# Patient Record
Sex: Male | Born: 1965 | Race: White | Hispanic: No | Marital: Married | Smoking: Never smoker
Health system: Southern US, Community
[De-identification: ages and names within clinical notes are randomized; demographics above are authoritative.]

## PROBLEM LIST (undated history)

## (undated) HISTORY — PX: WRIST SURGERY: SHX841

---

## 2011-05-16 ENCOUNTER — Encounter (HOSPITAL_COMMUNITY): Payer: Self-pay | Admitting: Emergency Medicine

## 2011-05-16 ENCOUNTER — Emergency Department (HOSPITAL_COMMUNITY): Payer: Worker's Compensation

## 2011-05-16 ENCOUNTER — Emergency Department (HOSPITAL_COMMUNITY)
Admission: EM | Admit: 2011-05-16 | Discharge: 2011-05-16 | Disposition: A | Discharge status: Home / Self Care | Payer: Worker's Compensation | Attending: Emergency Medicine | Admitting: Emergency Medicine

## 2011-05-16 DIAGNOSIS — Y9269 Other specified industrial and construction area as the place of occurrence of the external cause: Secondary | ICD-10-CM | POA: Insufficient documentation

## 2011-05-16 DIAGNOSIS — R0602 Shortness of breath: Secondary | ICD-10-CM | POA: Insufficient documentation

## 2011-05-16 DIAGNOSIS — IMO0002 Reserved for concepts with insufficient information to code with codable children: Secondary | ICD-10-CM | POA: Insufficient documentation

## 2011-05-16 DIAGNOSIS — S93401A Sprain of unspecified ligament of right ankle, initial encounter: Secondary | ICD-10-CM

## 2011-05-16 DIAGNOSIS — S93409A Sprain of unspecified ligament of unspecified ankle, initial encounter: Secondary | ICD-10-CM | POA: Insufficient documentation

## 2011-05-16 MED ORDER — IBUPROFEN 800 MG PO TABS
800.0000 mg | ORAL_TABLET | Freq: Once | ORAL | Status: AC
  Administered 2011-05-16: 800 mg via ORAL
  Filled 2011-05-16: qty 1

## 2011-05-16 NOTE — ED Provider Notes (Signed)
Medical screening examination/treatment/procedure(s) were performed by non-physician practitioner and as supervising physician I was immediately available for consultation/collaboration.   Joya Gaskins, MD 05/16/11 [phone]

## 2011-05-16 NOTE — ED Provider Notes (Signed)
History     CSN: [id number]  Arrival date & time 05/16/11  1745   First MD Initiated Contact with Patient 05/16/11 1758      Chief Complaint  Patient presents with  . Ankle Pain    (Consider location/radiation/quality/duration/timing/severity/associated sxs/prior treatment) Patient is a 46 y.o. male presenting with ankle pain. The history is provided by the patient. No language interpreter was used.  Ankle Pain  The incident occurred 1 to 2 hours ago. The incident occurred at work. There was no injury mechanism. The pain is present in the right ankle. The pain is moderate. The pain has been constant since onset. Associated symptoms comments: Pain with weight bearing.. .    History reviewed. No pertinent past medical history.  History reviewed. No pertinent past surgical history.  History reviewed. No pertinent family history.  History  Substance Use Topics  . Smoking status: Never Smoker   . Smokeless tobacco: Not on file  . Alcohol Use: Yes      Review of Systems  Constitutional: Negative for fever.  Respiratory: Positive for shortness of breath. Negative for wheezing and stridor.        R lateral rib pain and PT.  Skin intact.  No crepitus.  All other systems reviewed and are negative.    Allergies  Review of patient's allergies indicates no known allergies.  Home Medications  No current outpatient prescriptions on file.  BP 133/94  Pulse 103  Temp(Src) 98 F (36.7 C) (Oral)  Resp 20  Ht 5\' 10"  (1.778 m)  Wt 275 lb (124.739 kg)  BMI 39.46 kg/m2  SpO2 100%  Physical Exam  Nursing note and vitals reviewed. Constitutional: He is oriented to person, place, and time. He appears well-developed and well-nourished. No distress.  HENT:  Head: Normocephalic and atraumatic.  Eyes: EOM are normal.  Neck: Normal range of motion.  Cardiovascular: Normal rate, regular rhythm, normal heart sounds and intact distal pulses.   Pulmonary/Chest: Effort normal and  breath sounds normal. No respiratory distress. He has no wheezes. He has no rales. He exhibits tenderness.  Abdominal: Soft. He exhibits no distension. There is no tenderness.  Musculoskeletal: Normal range of motion.       Feet:  Neurological: He is alert and oriented to person, place, and time.  Skin: Skin is warm and dry. He is not diaphoretic.  Psychiatric: He has a normal mood and affect. Judgment normal.    ED Course  Procedures (including critical care time)  Labs Reviewed - No data to display No results found.   No diagnosis found.    MDM          Worthy Rancher, PA 05/16/11 1956

## 2011-05-16 NOTE — ED Notes (Signed)
Pt returned from xray

## 2011-05-16 NOTE — ED Notes (Signed)
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 900lbs

## 2011-05-16 NOTE — ED Notes (Signed)
Raiford Noble, [place] in prior to RN, see PA assessment for further

## 2011-07-28 ENCOUNTER — Ambulatory Visit: Payer: Self-pay

## 2012-10-26 IMAGING — CR RIGHT ANKLE - COMPLETE 3+ VIEW
1 series · 5 of 5 positions shown · non-contrast
Comparison: none

REASON FOR EXAM: R ankle pain due to fall
COMMENTS:

PROCEDURE:     MDR - MDR ANKLE RIGHT COMPLETE  - July 28, 2011  [DATE]
RESULT:

[Series 1: ap · 0.17mm/px · 5 of 5 slices shown]
[im 1/5]
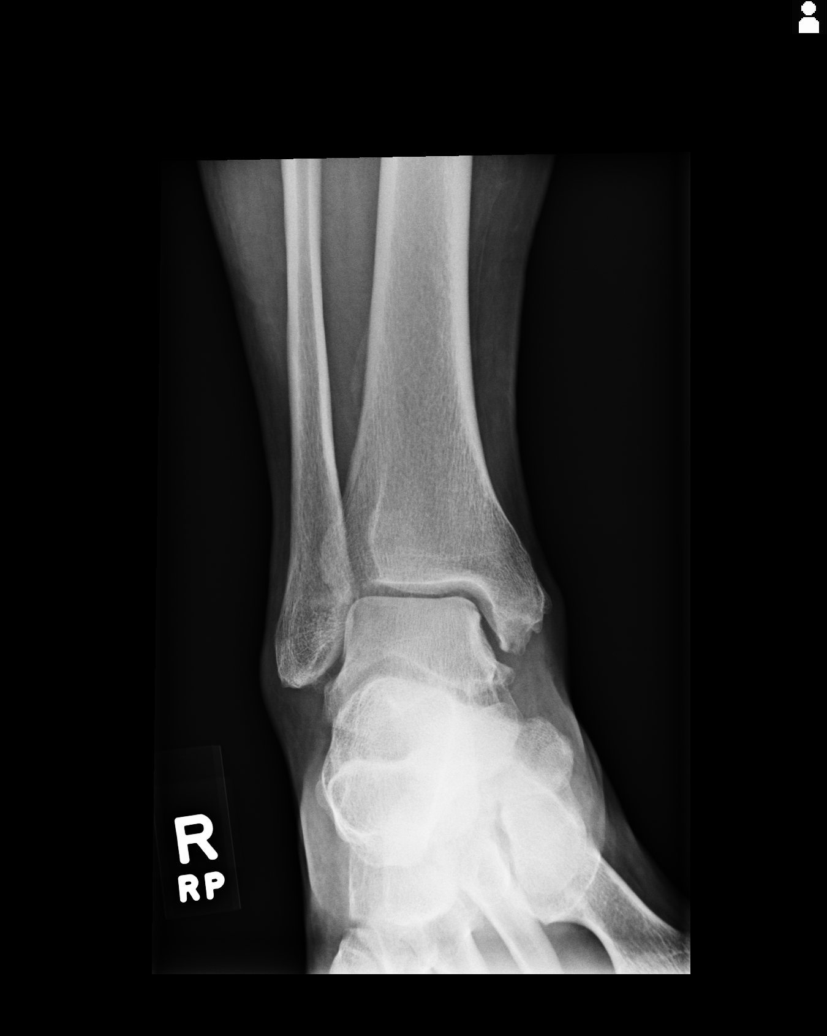
[im 2/5]
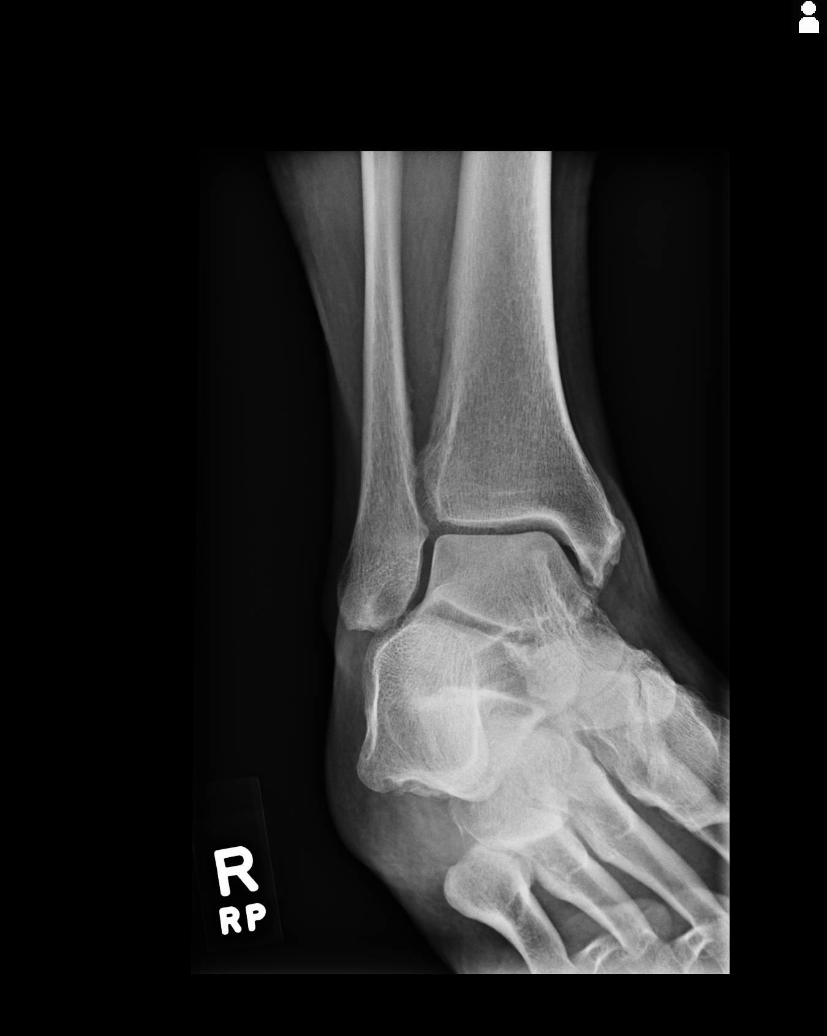
[im 3/5]
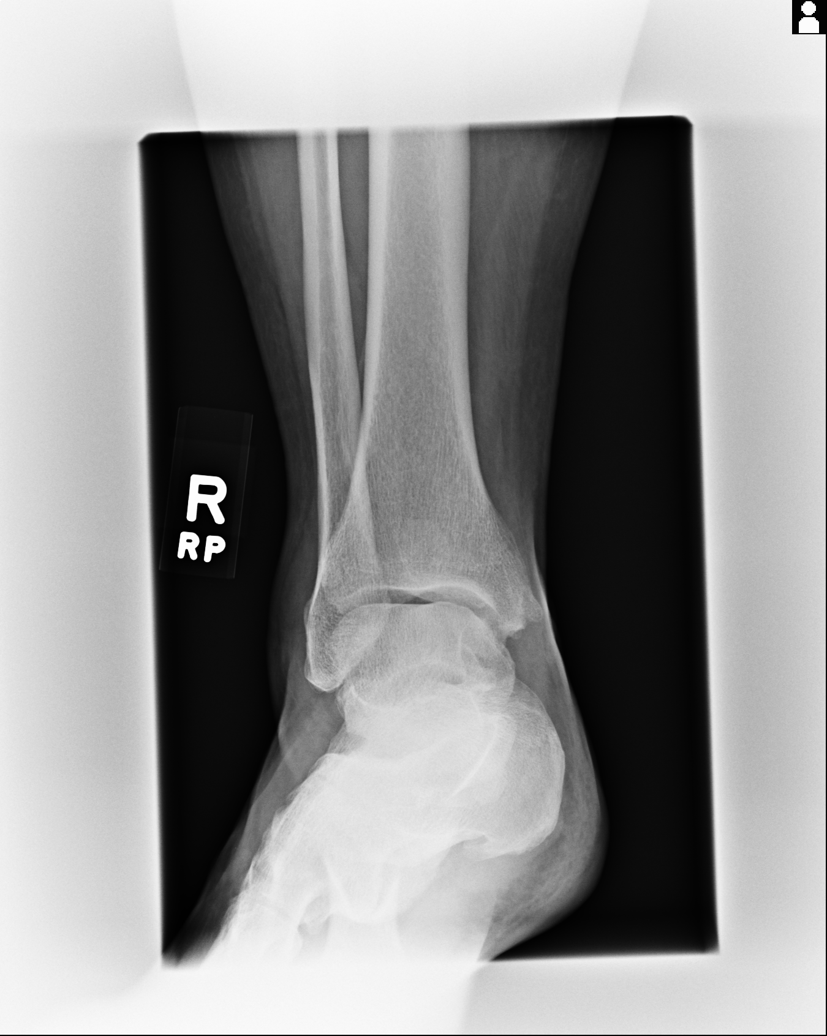
[im 4/5]
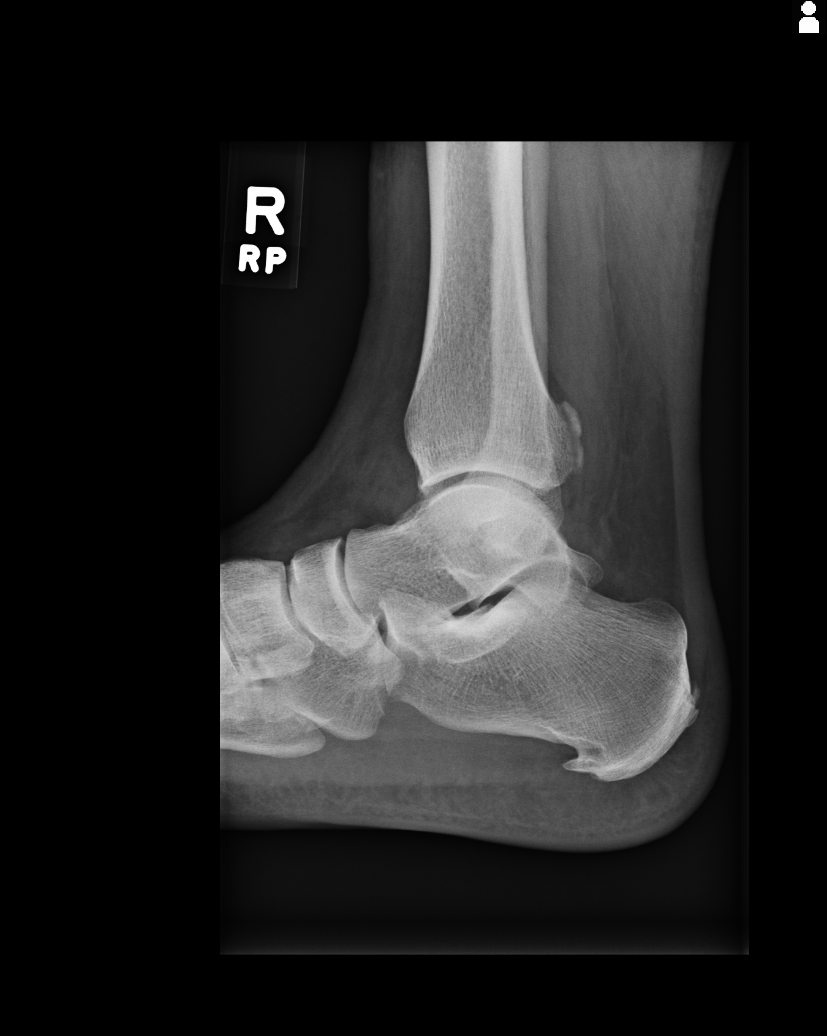
[im 5/5]
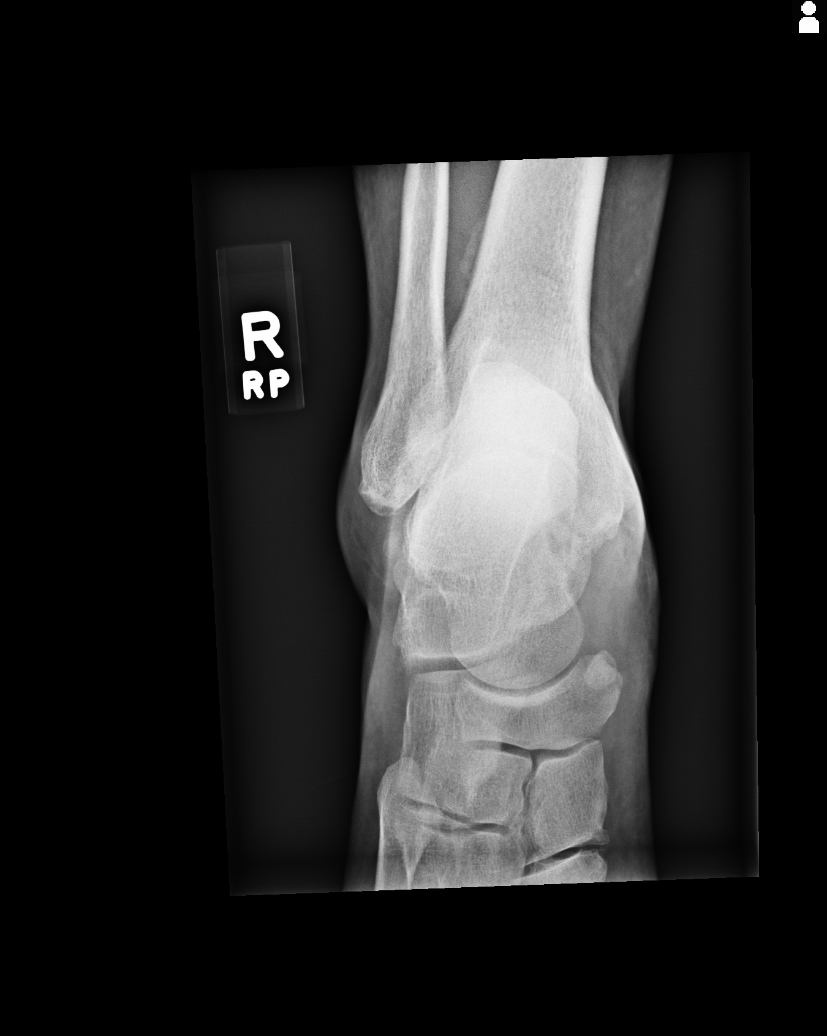

[5 of 5 positions shown; findings below may reference images not displayed]

FINDINGS: There is no evidence of fracture, dislocation or malalignment.
There is evidence of degenerative changes evident by peripheral
osteophytosis.
IMPRESSION: Mild osteoarthritic changes without evidence of acute osseous abnormalities.

## 2012-10-26 IMAGING — CR DG KNEE COMPLETE 4+V*R*
1 series · 5 of 5 positions shown · non-contrast
Comparison: none

REASON FOR EXAM: R knee pain due to fall
COMMENTS:

PROCEDURE:     MDR - MDR KNEE RT COMPLETE W/OBLIQUES  - July 28, 2011  [DATE]
RESULT:     There is no evidence of fracture, dislocation, or malalignment.

[Series 1: ap · 0.17mm/px · 5 of 5 slices shown]
[im 1/5]
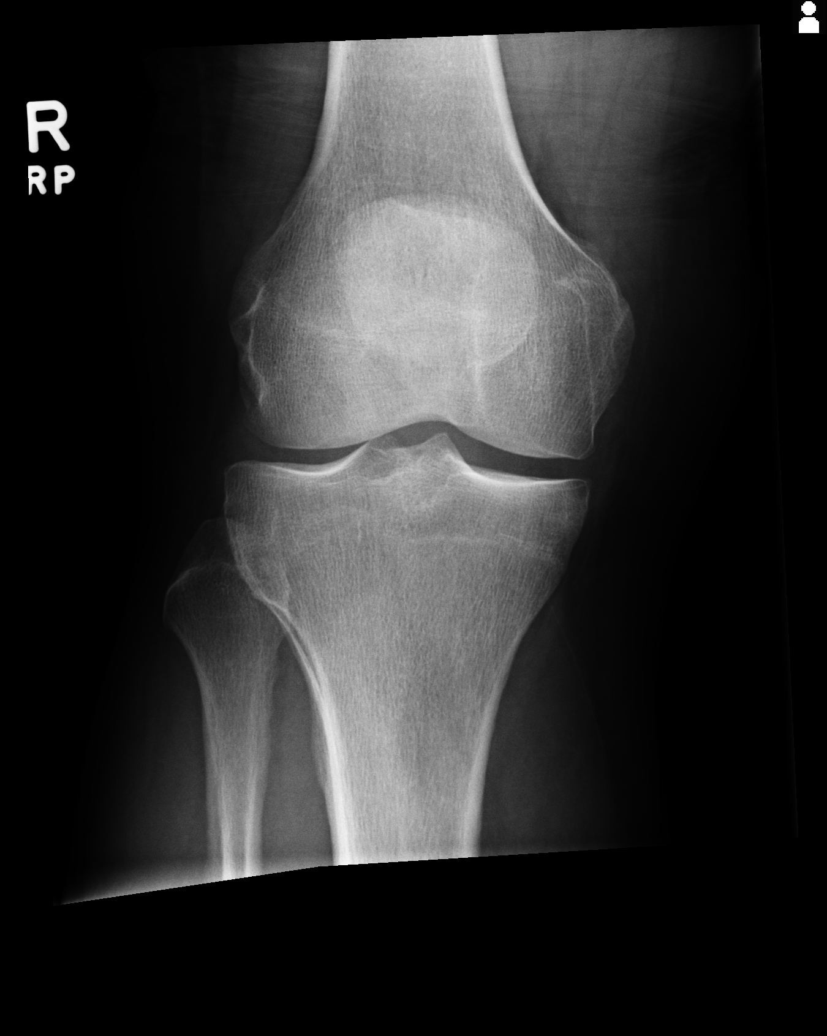
[im 2/5]
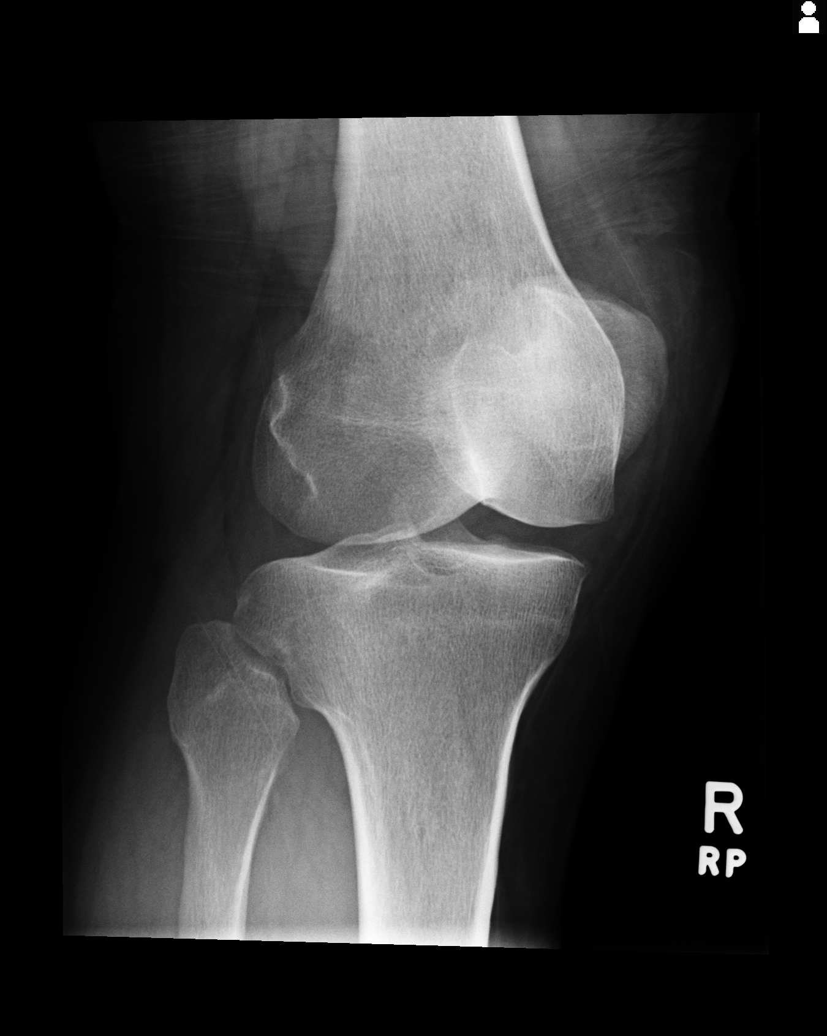
[im 3/5]
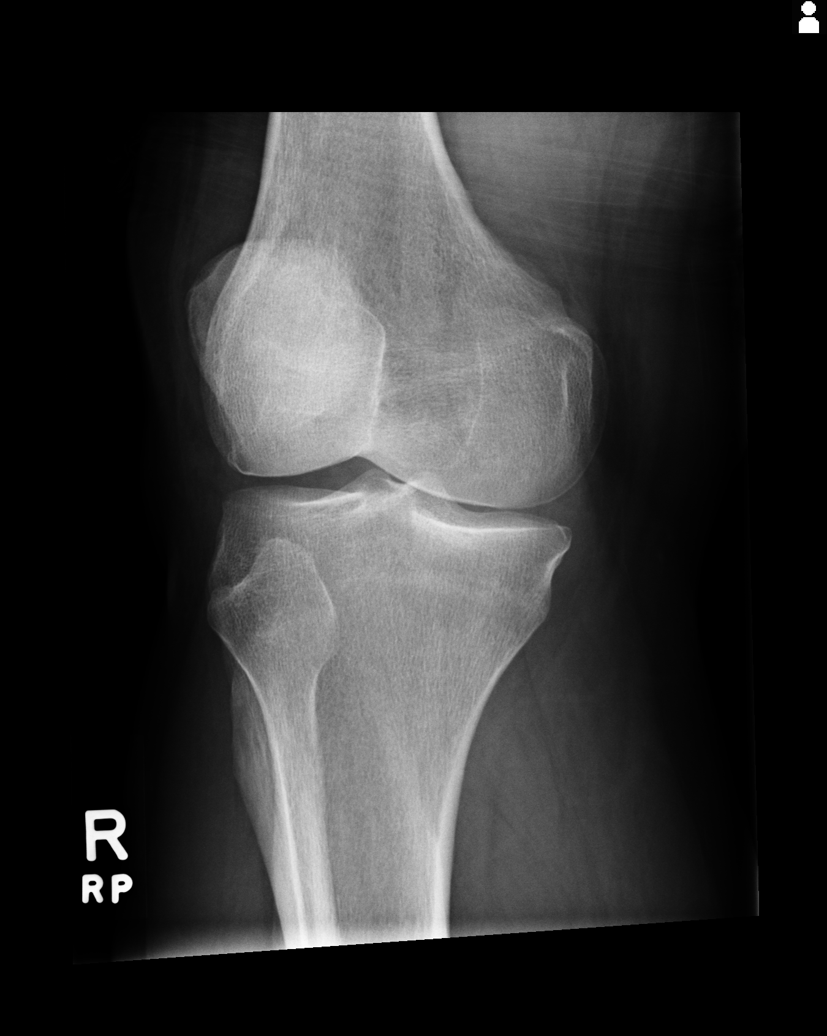
[im 4/5]
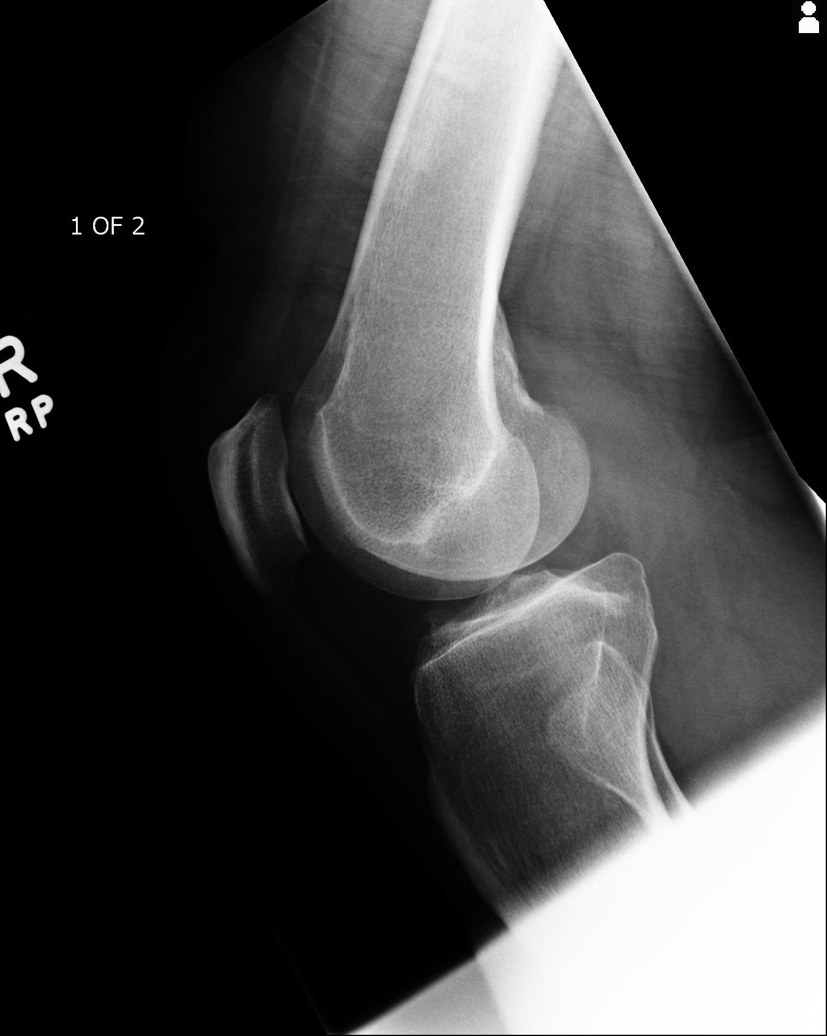
[im 5/5]
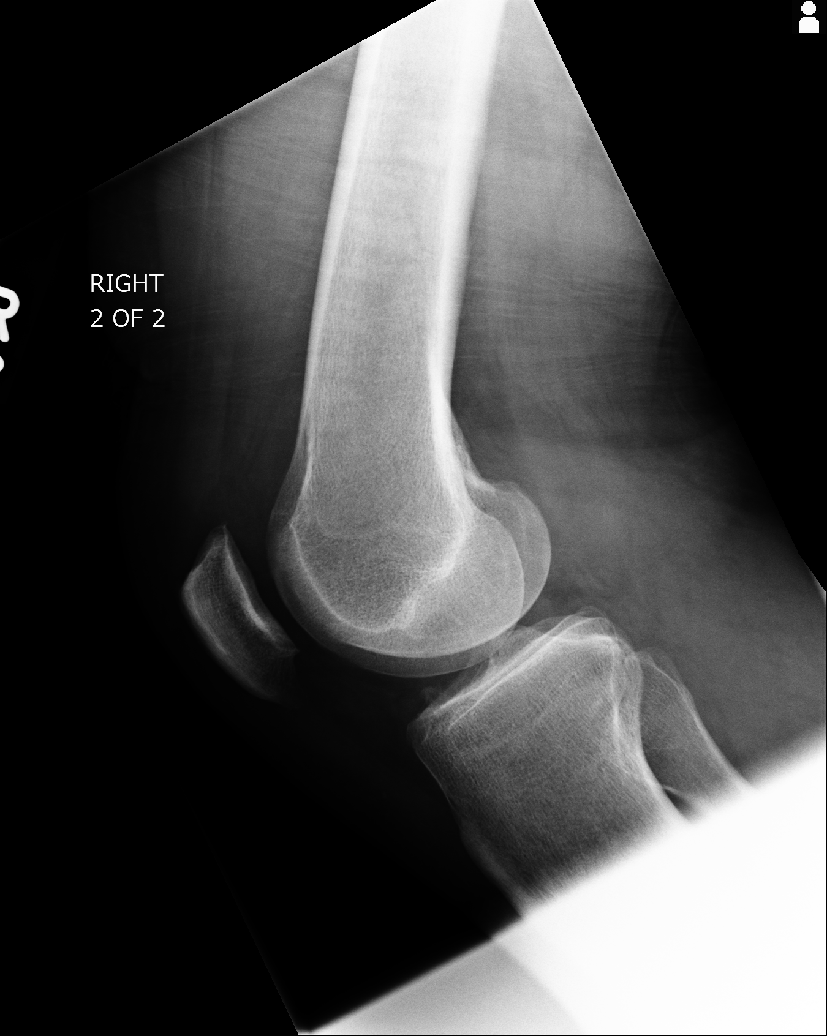

[5 of 5 positions shown; findings below may reference images not displayed]

IMPRESSION: 1. No evidence of acute abnormalities.
2. If there are persistent complaints of pain or persistent clinical
concern, a repeat evaluation in 7-10 days is recommended if clinically
warranted.

## 2014-04-14 ENCOUNTER — Ambulatory Visit: Payer: Self-pay | Admitting: Specialist

## 2014-08-15 NOTE — Op Note (Signed)
PATIENT NAME:  Jon Thompson, [name] MR#:  [id number] DATE OF BIRTH:  April 28, 1965  DATE OF PROCEDURE:  04/14/2014  PREOPERATIVE DIAGNOSIS: Comminuted displaced distal right radius fracture.   POSTOPERATIVE DIAGNOSIS: Comminuted displaced distal right radius fracture.   PROCEDURES: 1. Open reduction of fracture fragments and then application of external fixator, distal right radius.  2. Carpal tunnel release.  SURGEON: Clare Gandyhristopher E. Sharanya Templin, MD  ANESTHESIA: General.  COMPLICATIONS: None.  TOURNIQUET TIME: 74 minutes.  DESCRIPTION OF PROCEDURE: Ancef 2 grams was given intravenously prior to the procedure. General anesthesia was induced. The right upper extremity was thoroughly scrubbed with Betadine, alcohol, and then prepped with ChloraPrep. Extremities wrapped out with the Esmarch bandage and pneumatic tourniquet elevated to 250 mmHg. Under Loupe magnification, a volar longitudinal incision was made over the flexor carpi radialis tendon and then dissection carried down, and the flexor pollicis longus median nerve and flexor radialis are retracted ulnarly. The quadratus muscle is incised longitudinally with the Bovie, revealing the fracture site. Blood clot and debris is removed from the fracture site, and the fracture fragments are loosened up and anatomic reduction was attempted.   Following this, the patient's fingers were placed in the finger traps with 10 pounds longitudinal traction. Closed reduction was performed. The reduction, as seen in the AP and lateral views using the FluoroScan, was seen to be as good as one might expect to get with the degree of comminution of this fracture. External fixator was then applied to the distal radius with 2 pins in the 2nd  metacarpal and 2 pins in the radial shaft. External fixator was applied and tightened. Carpal tunnel release was then performed through a small volar incision. The dissection was carried down to the transverse retinacular ligament. This was  incised in the mid portion using the knife. The proximal and distal releases were performed with the small scissors.   All wounds were thoroughly irrigated multiple times. The skin edges were infiltrated with 0.5% plain Marcaine. Marcaine block was also performed at the elbow, at the median nerve, and the ulnar nerve. The carpal tunnel incision is closed with 4-0 nylon. The volar radial incision in the distal forearm was closed with subcutaneous 2-0 Vicryl and a running subcuticular 4-0 nylon. Steri-Strips also were applied. Betadine dressings were applied to the pin sites. Complete bulky dressing was applied, along with an Ace bandage overlying the external fixator. The tourniquet is released, and the patient is returned to the recovery room in satisfactory condition, having  tolerated the procedure quite well.    ____________________________ Clare Gandyhristopher E. Alizah Sills, MD ces:mw D: 04/14/2014 11:47:00 ET T: 04/14/2014 12:04:30 ET JOB#: [id number]  cc: Clare Gandyhristopher E. Shahram Alexopoulos, MD, <Dictator> Clare GandyHRISTOPHER E Jovahn Breit MD ELECTRONICALLY SIGNED 04/19/2014 16:57

## 2021-08-08 ENCOUNTER — Emergency Department
Admission: EM | Admit: 2021-08-08 | Discharge: 2021-08-08 | Disposition: A | Discharge status: Home / Self Care | Payer: Self-pay | Attending: Emergency Medicine | Admitting: Emergency Medicine

## 2021-08-08 ENCOUNTER — Emergency Department: Payer: Self-pay

## 2021-08-08 ENCOUNTER — Encounter: Payer: Self-pay | Admitting: Emergency Medicine

## 2021-08-08 ENCOUNTER — Other Ambulatory Visit: Payer: Self-pay

## 2021-08-08 DIAGNOSIS — Z7982 Long term (current) use of aspirin: Secondary | ICD-10-CM | POA: Insufficient documentation

## 2021-08-08 DIAGNOSIS — R319 Hematuria, unspecified: Secondary | ICD-10-CM | POA: Insufficient documentation

## 2021-08-08 DIAGNOSIS — R369 Urethral discharge, unspecified: Secondary | ICD-10-CM | POA: Insufficient documentation

## 2021-08-08 LAB — URINALYSIS, ROUTINE W REFLEX MICROSCOPIC
RBC / HPF: >50 RBC/hpf — ABNORMAL HIGH (ref 0–5)
Specific Gravity, Urine: 1.015 (ref 1.005–1.030)
Squamous Epithelial / LPF: NONE SEEN (ref 0–5)

## 2021-08-08 NOTE — ED Triage Notes (Signed)
Pt via POV from home. Pt states he was bleeding from his penis. States that it started this AM but has stopped now. When asked if it was just blood or if it was in his urine pt stated both. Denies urinary symptoms. Denies penile discharge. Denies any pain. Denies any swelling. Denies any surgeries on his groin or penis. Pt is A&OX4 and NAD.  ?

## 2021-08-08 NOTE — Discharge Instructions (Signed)
Call make an appoint with Dr. Lonna Cobb if you continue to have blood in your urine.  His contact information and address are listed on your discharge papers.  Drink lots of fluids today to dilute your urine.  Do not take aspirin or any anti-inflammatories as this could make the bleeding worse. ?

## 2021-08-08 NOTE — ED Notes (Signed)
See triage note  states he noticed blood on the end of his penis this am  states he is not having any pain ?

## 2021-08-08 NOTE — ED Provider Notes (Signed)
? ?[facility] ?Provider Note ? ? ? Event Date/Time  ? First MD Initiated Contact with Patient 08/08/21 [phone]   ?  (approximate) ? ? ?History  ? ?Penile Discharge ? ? ?HPI ? ?Jon Thompson is a 56 y.o. male   presents to the ED with complaint of bleeding from his penis.  Patient states that it started this morning but by the time he got to the emergency department and stopped.  Patient denies any urinary symptoms, prior infections or kidney stones.  Patient denies blood thinners or aspirin per day.  Patient denies any pain. ? ?  ? ? ?Physical Exam  ? ?Triage Vital Signs: ?ED Triage Vitals  ?Enc Vitals Group  ?   BP 08/08/21 0749 (!) 127/99  ?   Pulse Rate 08/08/21 0749 61  ?   Resp 08/08/21 0749 18  ?   Temp 08/08/21 0749 (!) 97.5 ?F (36.4 ?C)  ?   Temp Source 08/08/21 0749 Oral  ?   SpO2 08/08/21 0749 98 %  ?   Weight 08/08/21 0746 275 lb (124.7 kg)  ?   Height 08/08/21 0746 5\' 11"  (1.803 m)  ?   Head Circumference --   ?   Peak Flow --   ?   Pain Score 08/08/21 0746 0  ?   Pain Loc --   ?   Pain Edu? --   ?   Excl. in GC? --   ? ? ?Most recent vital signs: ?Vitals:  ? 08/08/21 0749 08/08/21 1015  ?BP: (!) 127/99 129/87  ?Pulse: 61 68  ?Resp: 18 18  ?Temp: (!) 97.5 ?F (36.4 ?C)   ?SpO2: 98% 98%  ? ? ? ?General: Awake, no distress.  ?CV:  Good peripheral perfusion.  Heart regular rate and rhythm. ?Resp:  Normal effort.  Lungs are clear bilaterally. ?Abd:  No distention.  No CVA tenderness bilaterally. ?Other:   ? ? ?ED Results / Procedures / Treatments  ? ?Labs ?(all labs ordered are listed, but only abnormal results are displayed) ?Labs Reviewed  ?URINALYSIS, ROUTINE W REFLEX MICROSCOPIC - Abnormal; Notable for the following components:  ?    Result Value  ? Color, Urine RED (*)   ? APPearance TURBID (*)   ? Glucose, UA   (*)   ? Value: TEST NOT REPORTED DUE TO COLOR INTERFERENCE OF URINE PIGMENT  ? Hgb urine dipstick   (*)   ? Value: TEST NOT REPORTED DUE TO COLOR INTERFERENCE OF URINE  PIGMENT  ? Bilirubin Urine   (*)   ? Value: TEST NOT REPORTED DUE TO COLOR INTERFERENCE OF URINE PIGMENT  ? Ketones, ur   (*)   ? Value: TEST NOT REPORTED DUE TO COLOR INTERFERENCE OF URINE PIGMENT  ? Protein, ur   (*)   ? Value: TEST NOT REPORTED DUE TO COLOR INTERFERENCE OF URINE PIGMENT  ? Nitrite   (*)   ? Value: TEST NOT REPORTED DUE TO COLOR INTERFERENCE OF URINE PIGMENT  ? Leukocytes,Ua   (*)   ? Value: TEST NOT REPORTED DUE TO COLOR INTERFERENCE OF URINE PIGMENT  ? RBC / HPF >50 (*)   ? Bacteria, UA RARE (*)   ? All other components within normal limits  ? ? ? ? ? ?PROCEDURES: ? ?Critical Care performed:  ? ?Procedures ? ? ?MEDICATIONS ORDERED IN ED: ?Medications - No data to display ? ? ?IMPRESSION / MDM / ASSESSMENT AND PLAN / ED COURSE  ?I reviewed the  triage vital signs and the nursing notes. ? ? ?Differential diagnosis includes, but is not limited to, hemorrhagic cystitis, urolithiasis, urinary tract infection. ? ?56 year old male presents to the ED with hematuria.  After lab work confirmed greater than 50,000 RBCs and a CT renal study was ordered patient shared more detailed information about the hematuria this morning.  Patient states that he had an erection this morning and most likely the bleeding is from trauma.  This is when he saw blood which resolved prior to arriving in the emergency department.  While in the ED he again saw some hematuria and also saw a blood clot with urination.  He is now discontinued hematuria and would like to leave.  We discussed the results of his test and also that it was important to follow-up with urology.  Dr. Lonna Cobb is on-call for urology and patient should make arrangements to be seen for further evaluation if the hematuria continues.  He is also welcome to return to the emergency department if there is worsening of his symptoms. ? ? ? ? ?FINAL CLINICAL IMPRESSION(S) / ED DIAGNOSES  ? ?Final diagnoses:  ?Hematuria, unspecified type  ? ? ? ?Rx / DC Orders  ? ?ED  Discharge Orders   ? ? None  ? ?  ? ? ? ?Note:  This document was prepared using Dragon voice recognition software and may include unintentional dictation errors. ?  ?Tommi Rumps, PA-C ?08/08/21 1611 ? ?  ?Jene Every, MD ?08/08/21 1613 ? ?

## 2023-01-31 ENCOUNTER — Ambulatory Visit
Admission: EM | Admit: 2023-01-31 | Discharge: 2023-01-31 | Disposition: A | Discharge status: Home / Self Care | Payer: Self-pay | Attending: Family Medicine | Admitting: Family Medicine

## 2023-01-31 DIAGNOSIS — I10 Essential (primary) hypertension: Secondary | ICD-10-CM

## 2023-01-31 DIAGNOSIS — H532 Diplopia: Secondary | ICD-10-CM

## 2023-01-31 DIAGNOSIS — R0789 Other chest pain: Secondary | ICD-10-CM

## 2023-01-31 MED ORDER — METHOCARBAMOL 500 MG PO TABS: 500.0000 mg | ORAL_TABLET | Freq: Two times a day (BID) | ORAL | 0 refills | Status: AC

## 2023-01-31 NOTE — ED Triage Notes (Signed)
Pt c/o vision crossing, HA & L sided neck pain x12 days. Also c/o chest tightness x2 days. Denies any SOB. States at times gets a quick pain in head that last about a sec but enough to get his attention.

## 2023-01-31 NOTE — Discharge Instructions (Addendum)
Follow up with Dr Rolley Sims, who is the on-call ophthalmologist for [facility].  If your symptoms suddenly worsening headache or double vision, go to the emergency department as you will need head imaging.  Take the muscle relaxer for your chest wall pain.  Avoid lifting anything greater than 25 lbs for the next 2 weeks.   Is important that you take your blood pressure medicines regularly.

## 2023-01-31 NOTE — ED Provider Notes (Signed)
[facility]    CSN: [id number] Arrival date & time: 01/31/23  1422      History   Chief Complaint Chief Complaint  Patient presents with   Eye Problem   Headache   Neck Pain    HPI Jon Thompson is a 57 y.o. male.   HPI   Jon Thompson presents for left eye vision issues.  Like his vision crosses for over a week.  He can still see out of the eye but looks like he crossed his eyes.  The symptoms last for a few seconds then self resolve. Has intermittent pain in his eye. Wears reading glasses.    Has dull substenal chest pain that started 2 days ago. Pain doesn't radiate.  Has intermittent head pain that last for a 10-15 seconds that goes away for about a day.    He left work earlier today when he wasn't feeling good.  BP 131/80 at home. Does not take any blood pressure medications.       History reviewed. No pertinent past medical history.  There are no problems to display for this patient.   Past Surgical History:  Procedure Laterality Date   WRIST SURGERY Right        Home Medications    Prior to Admission medications   Medication Sig Start Date End Date Taking? Authorizing Provider  methocarbamol (ROBAXIN) 500 MG tablet Take 1 tablet (500 mg total) by mouth 2 (two) times daily. 01/31/23  Yes Katha Cabal, DO    Family History History reviewed. No pertinent family history.  Social History Social History   Tobacco Use   Smoking status: Never  Vaping Use   Vaping status: Never Used  Substance Use Topics   Alcohol use: Yes   Drug use: No     Allergies   Hydrocodone   Review of Systems Review of Systems: negative unless otherwise stated in HPI.      Physical Exam Triage Vital Signs ED Triage Vitals  Encounter Vitals Group     BP 01/31/23 1525 (!) 153/93     Systolic BP Percentile --      Diastolic BP Percentile --      Pulse Rate 01/31/23 1525 69     Resp 01/31/23 1525 16     Temp 01/31/23 1525 98.4 F (36.9 C)     Temp  Source 01/31/23 1525 Oral     SpO2 01/31/23 1525 96 %     Weight 01/31/23 1525 250 lb (113.4 kg)     Height 01/31/23 1525 5\' 10"  (1.778 m)     Head Circumference --      Peak Flow --      Pain Score 01/31/23 1530 0     Pain Loc --      Pain Education --      Exclude from Growth Chart --    No data found.  Updated Vital Signs BP (!) 153/93 (BP Location: Left Arm)   Pulse 69   Temp 98.4 F (36.9 C) (Oral)   Resp 16   Ht 5\' 10"  (1.778 m)   Wt 113.4 kg   SpO2 96%   BMI 35.87 kg/m   Visual Acuity Right Eye Distance: 20/20 (Uncorrected) Left Eye Distance: 20/15 (Uncorrected) Bilateral Distance: 20/13 (Uncorrected)  Right Eye Near:   Left Eye Near:    Bilateral Near:     Physical Exam GEN:     alert, non-ill appearing male and no distress    HENT:  mucus membranes moist, oropharyngeal without lesions or erythema,  nares patent, no nasal discharge  EYES:   pupils equal and reactive to light and accommodation, EOM intact NECK:  supple, normal ROM, no JVP  RESP:  clear to auscultation bilaterally, no increased work of breathing  CVS:   regular rate and rhythm, pulses intact   EXT:   normal ROM, atraumatic, no edema  NEURO:  alert, oriented, speech normal, CN 2-12 grossly intact, no facial droop,  sensation grossly intact, strength 5/5 bilateral UE and LE, normal coordination Skin:   warm and dry, brisk cap refill       UC Treatments / Results  Labs (all labs ordered are listed, but only abnormal results are displayed) Labs Reviewed - No data to display  EKG  If EKG performed, see my interpretation in the MDM section  Radiology No results found.   Procedures Procedures (including critical care time)  Medications Ordered in UC Medications - No data to display  Initial Impression / Assessment and Plan / UC Course  I have reviewed the triage vital signs and the nursing notes.  Pertinent labs & imaging results that were available during my care of the patient  were reviewed by me and considered in my medical decision making (see chart for details).       Patient is a 57 y.o. male  who presents for remittent diplopia for the past couple weeks and acute dull substernal chest pain that started a couple days ago.  Overall patient is nontoxic-appearing and afebrile.  Jon Thompson is hypertensive here.  BP 153/93.  Reports home blood pressures not elevated in the 130s/80s.. Previous blood pressures from chart review were normal.  Recommended he check hisblood pressure and follow up with his primary care provider in the next 2 weeks, if they remain elevated.     ED and return precautions given and patient/guardian voiced understanding. Discussed MDM, treatment plan and plan for follow-up with patient*** who agrees with plan.     Discussed MDM, treatment plan and plan for follow-up with patient***who agrees with plan.   Final Clinical Impressions(s) / UC Diagnoses   Final diagnoses:  Diplopia  Chest wall pain  Elevated blood pressure reading with diagnosis of hypertension     Discharge Instructions      Follow up with Dr Rolley Sims, who is the on-call ophthalmologist for [facility].  If your symptoms suddenly worsening headache or double vision, go to the emergency department as you will need head imaging.  Take the muscle relaxer for your chest wall pain.  Avoid lifting anything greater than 25 lbs for the next 2 weeks.   Is important that you take your blood pressure medicines regularly.     ED Prescriptions     Medication Sig Dispense Auth. Provider   methocarbamol (ROBAXIN) 500 MG tablet Take 1 tablet (500 mg total) by mouth 2 (two) times daily. 20 tablet Katha Cabal, DO      PDMP not reviewed this encounter.
